# Patient Record
Sex: Female | Born: 1985 | Race: White | Hispanic: No | Marital: Single | Smoking: Never smoker
Health system: Southern US, Community
[De-identification: ages and names within clinical notes are randomized; demographics above are authoritative.]

## PROBLEM LIST (undated history)

## (undated) DIAGNOSIS — R102 Pelvic and perineal pain: Secondary | ICD-10-CM

## (undated) DIAGNOSIS — R350 Frequency of micturition: Secondary | ICD-10-CM

## (undated) DIAGNOSIS — R3915 Urgency of urination: Secondary | ICD-10-CM

---
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
## 2007-02-02 ENCOUNTER — Emergency Department (HOSPITAL_COMMUNITY): Admission: EM | Admit: 2007-02-02 | Discharge: 2007-02-02 | Payer: Self-pay | Admitting: Emergency Medicine

## 2007-12-30 IMAGING — CR DG ABDOMEN ACUTE W/ 1V CHEST
3 series · 3 of 3 positions shown · non-contrast
Comparison: None

CLINICAL DATA: Abdominal pain

ABDOMEN SERIES - 2 VIEW & CHEST - 1 VIEW

[w chest pa]
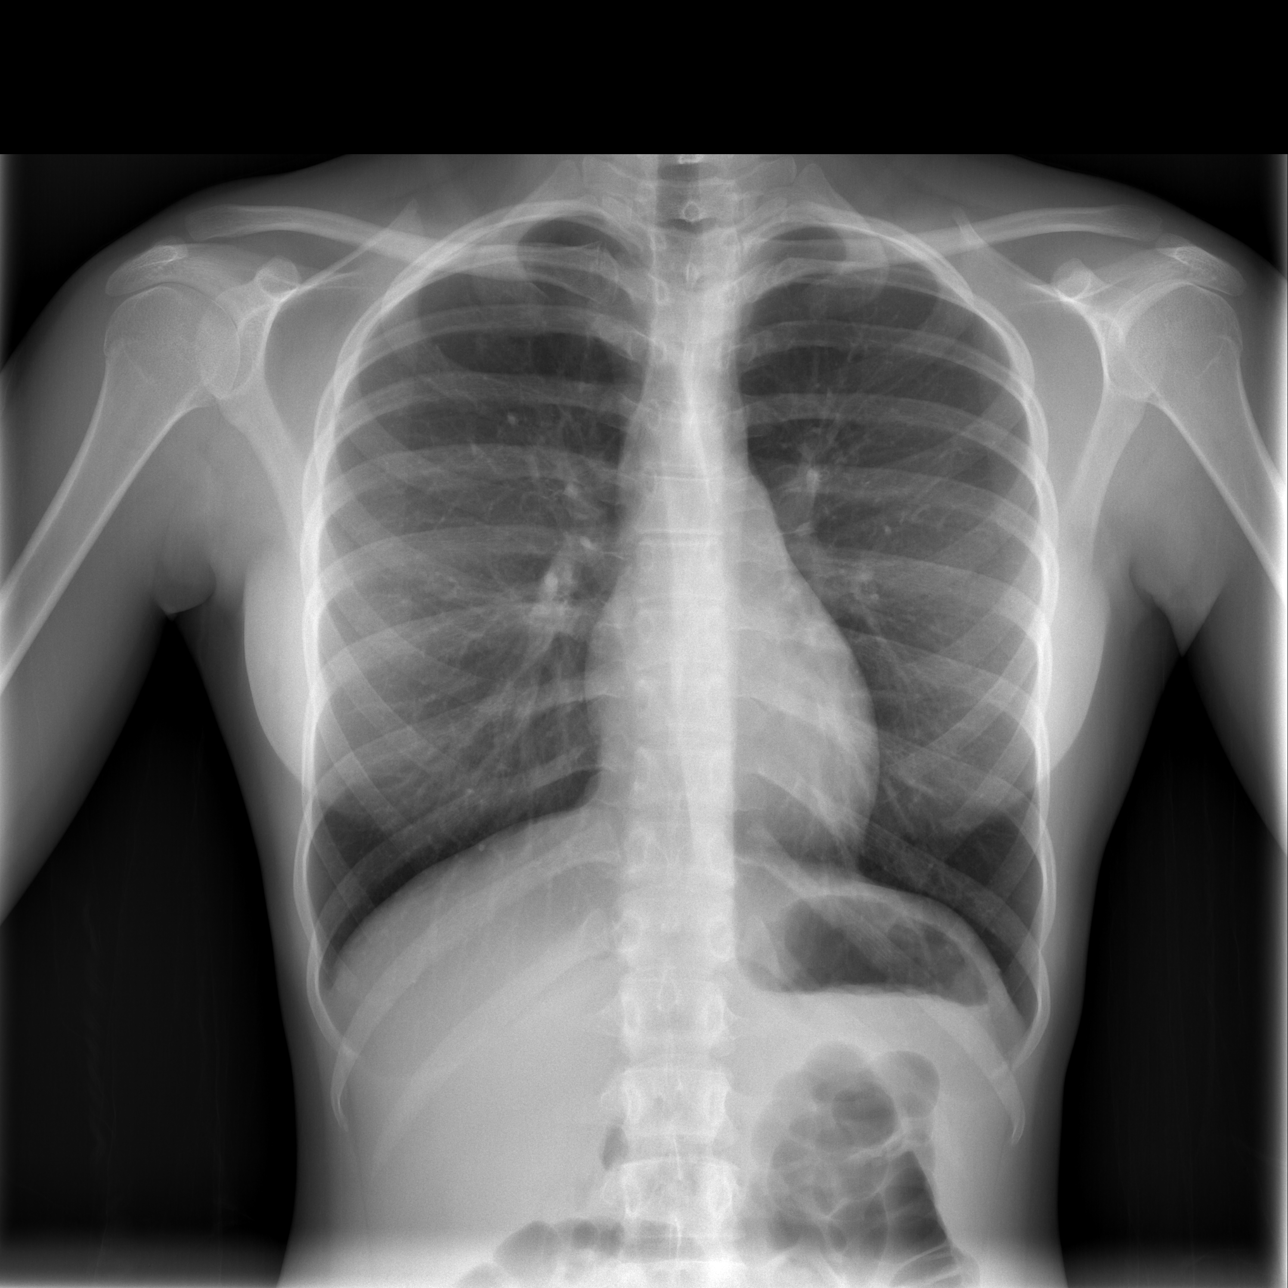

[w abdomen upright *]
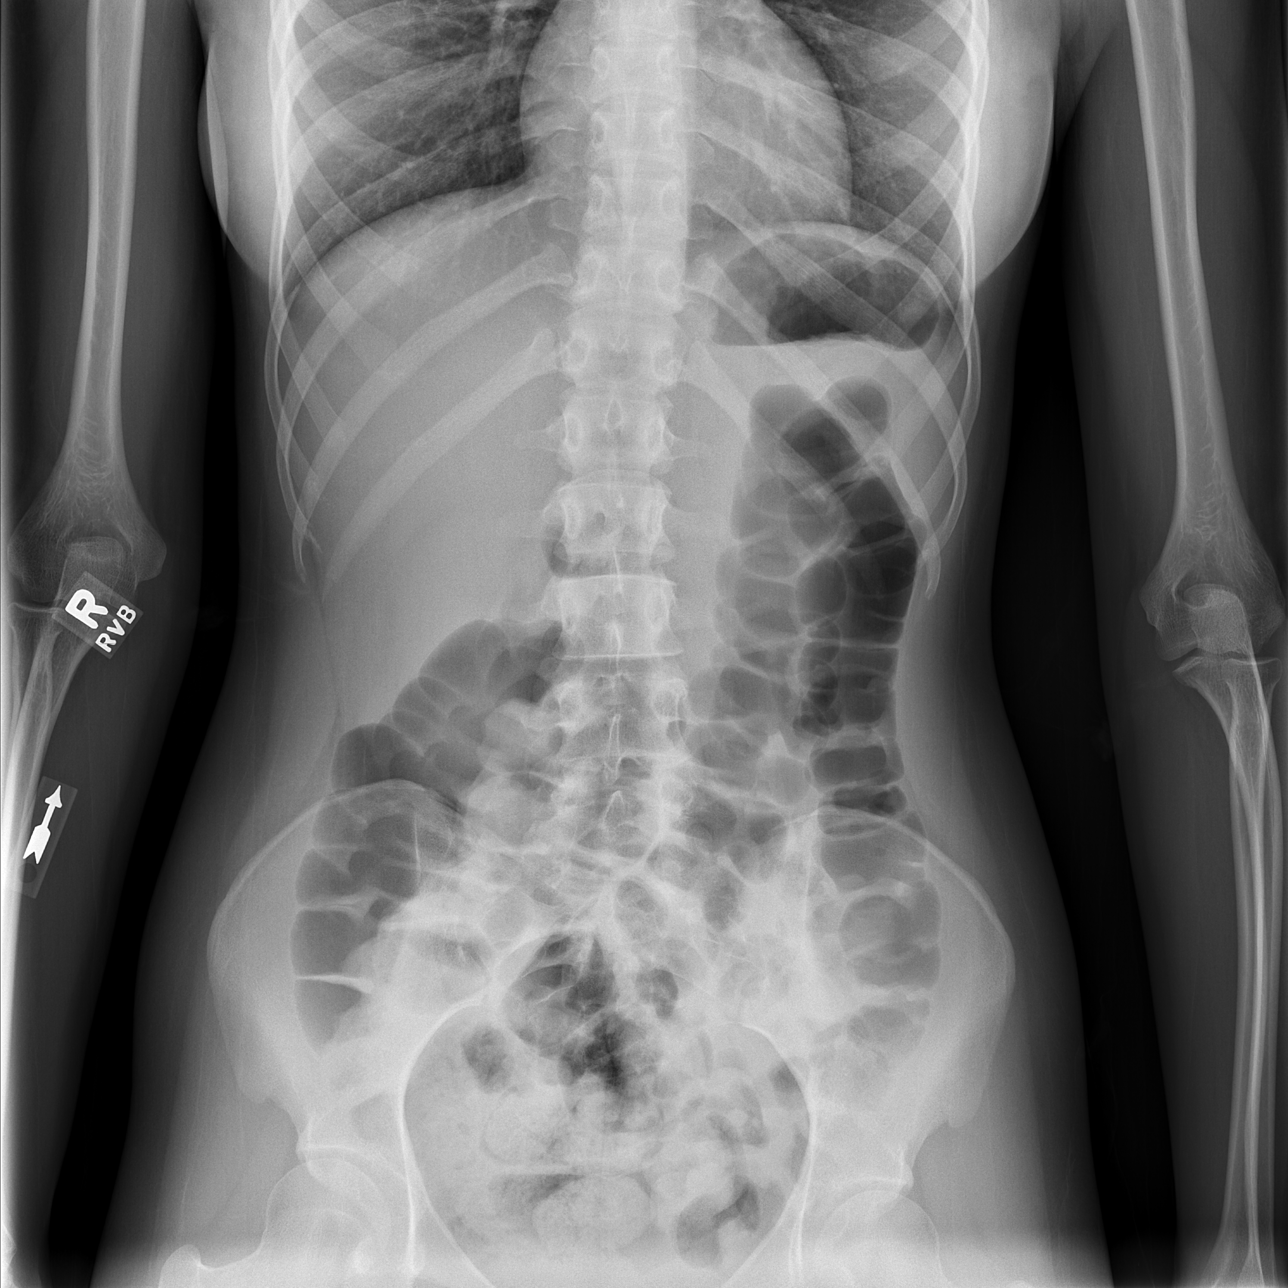

[t abdomen supine]
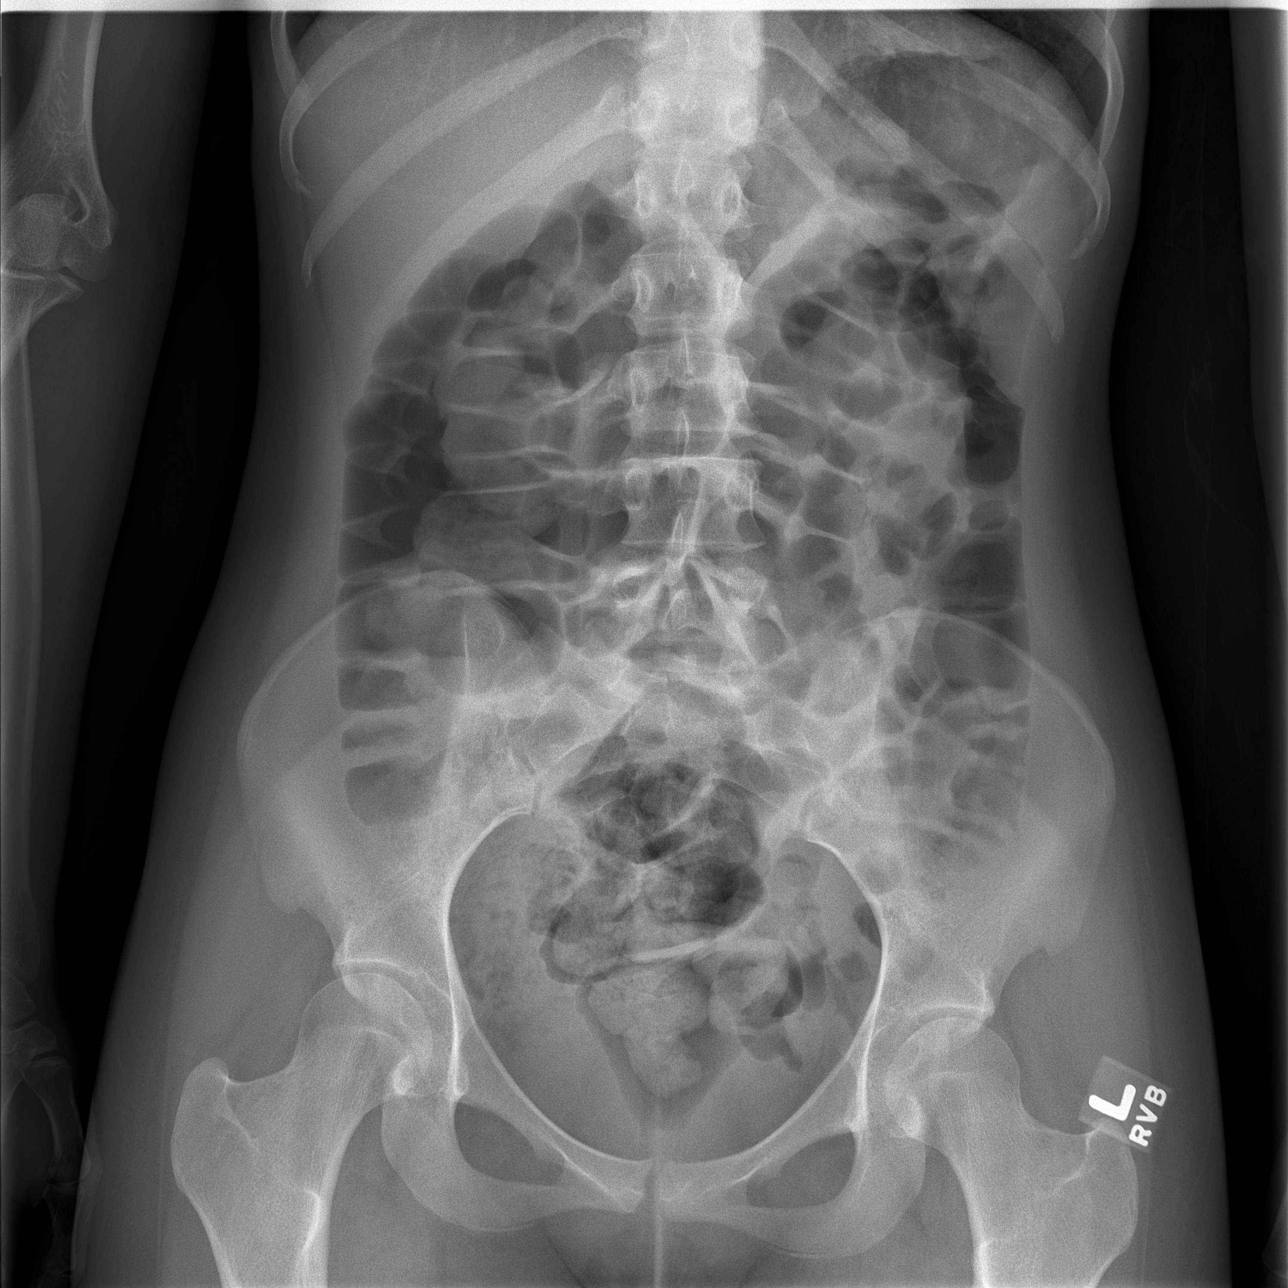

[3 of 3 positions shown; findings below may reference images not displayed]

FINDINGS: Heart and mediastinal contours are within normal limits. Lungs are
clear.

There is a nonobstructive bowel gas pattern. Mild gaseous distention is noted of
the bowel diffusely. No free air. No organomegaly or suspicious calcification.

IMPRESSION

Mild diffuse gaseous distention of bowel, possibly mild ileus. No obstruction or
free air.

## 2009-04-08 ENCOUNTER — Encounter (INDEPENDENT_AMBULATORY_CARE_PROVIDER_SITE_OTHER): Payer: Self-pay | Admitting: *Deleted

## 2009-04-25 ENCOUNTER — Ambulatory Visit: Payer: Self-pay | Admitting: Family Medicine

## 2009-04-25 DIAGNOSIS — R109 Unspecified abdominal pain: Secondary | ICD-10-CM | POA: Insufficient documentation

## 2009-04-26 ENCOUNTER — Encounter (INDEPENDENT_AMBULATORY_CARE_PROVIDER_SITE_OTHER): Payer: Self-pay | Admitting: *Deleted

## 2009-04-26 LAB — CONVERTED CEMR LAB
ALT: 17 units/L (ref 0–35)
Albumin: 4.5 g/dL (ref 3.5–5.2)
Alkaline Phosphatase: 50 units/L (ref 39–117)
Bilirubin, Direct: 0 mg/dL (ref 0.0–0.3)
Cholesterol: 170 mg/dL (ref 0–200)
Eosinophils Absolute: 0.1 10*3/uL (ref 0.0–0.7)
Eosinophils Relative: 0.8 % (ref 0.0–5.0)
HCT: 41.9 % (ref 36.0–46.0)
HDL: 49.6 mg/dL (ref >39.00)
Hemoglobin: 14.5 g/dL (ref 12.0–15.0)
Lymphocytes Relative: 35.9 % (ref 12.0–46.0)
Lymphs Abs: 2.3 10*3/uL (ref 0.7–4.0)
Neutro Abs: 3.4 10*3/uL (ref 1.4–7.7)
Neutrophils Relative %: 54.4 % (ref 43.0–77.0)
Total CHOL/HDL Ratio: 3
Total Protein: 8.1 g/dL (ref 6.0–8.3)
Triglycerides: 71 mg/dL (ref 0.0–149.0)
VLDL: 14.2 mg/dL (ref 0.0–40.0)
WBC: 6.4 10*3/uL (ref 4.5–10.5)

## 2009-06-22 ENCOUNTER — Ambulatory Visit: Payer: Self-pay | Admitting: Family Medicine

## 2009-07-11 ENCOUNTER — Ambulatory Visit: Payer: Self-pay | Admitting: Internal Medicine

## 2009-07-11 LAB — CONVERTED CEMR LAB
Bilirubin Urine: NEGATIVE
Blood in Urine, dipstick: NEGATIVE
Glucose, Urine, Semiquant: NEGATIVE
WBC Urine, dipstick: NEGATIVE
pH: 6

## 2009-07-12 ENCOUNTER — Telehealth: Payer: Self-pay | Admitting: Internal Medicine

## 2010-01-05 ENCOUNTER — Ambulatory Visit: Payer: Self-pay | Admitting: Family Medicine

## 2010-01-05 DIAGNOSIS — R599 Enlarged lymph nodes, unspecified: Secondary | ICD-10-CM | POA: Insufficient documentation

## 2010-01-20 ENCOUNTER — Telehealth (INDEPENDENT_AMBULATORY_CARE_PROVIDER_SITE_OTHER): Payer: Self-pay | Admitting: *Deleted

## 2010-01-25 ENCOUNTER — Encounter: Admission: RE | Admit: 2010-01-25 | Discharge: 2010-01-25 | Payer: Self-pay | Admitting: Family Medicine

## 2010-01-26 ENCOUNTER — Telehealth (INDEPENDENT_AMBULATORY_CARE_PROVIDER_SITE_OTHER): Payer: Self-pay | Admitting: *Deleted

## 2010-01-30 ENCOUNTER — Encounter (INDEPENDENT_AMBULATORY_CARE_PROVIDER_SITE_OTHER): Payer: Self-pay | Admitting: *Deleted

## 2010-02-01 ENCOUNTER — Ambulatory Visit: Payer: Self-pay | Admitting: Family Medicine

## 2010-02-01 DIAGNOSIS — Z9189 Other specified personal risk factors, not elsewhere classified: Secondary | ICD-10-CM | POA: Insufficient documentation

## 2010-02-03 ENCOUNTER — Encounter: Payer: Self-pay | Admitting: Family Medicine

## 2010-10-08 ENCOUNTER — Encounter: Payer: Self-pay | Admitting: Internal Medicine

## 2010-10-17 NOTE — Progress Notes (Signed)
Summary: ultrasound results  Phone Note Outgoing Call   Call placed by: Doristine Devoid,  Jan 26, 2010 11:13 AM Call placed to: Patient Summary of Call: lump pt is feeling is normal appearing lymph node.  please let her know   Follow-up for Phone Call        spoke w/ patient aware of ultrasound results.........Marland KitchenDoristine Devoid  Jan 26, 2010 11:15 AM

## 2010-10-17 NOTE — Assessment & Plan Note (Signed)
Summary: lymph node under arm/kdc   Vital Signs:  Patient profile:   25 year old female Weight:      102 pounds Temp:     99.2 degrees F oral BP sitting:   100 / 60  (left arm)  Vitals Entered By: Doristine Devoid (January 05, 2010 11:55 AM) CC: swollen and tender lymph node under both arms x1 week    History of Present Illness: 25 yo woman here today for swollen and tender LN.  first noticed 1 week ago.  located under L arm, some discomfort under R arm.  no drainage.  no redness.  uses disposable razors.  not visible on surface, only palpable.  no fevers or recent illness.  no other LNs.  Current Medications (verified): 1)  Aviane 0.1-20 Mg-Mcg Tabs (Levonorgestrel-Ethinyl Estrad) .... Take One Tablet Daily 2)  Align .Marland Kitchen.. 1 Daily 3)  Dicyclomine Hcl 10 Mg Caps (Dicyclomine Hcl) .... One or Two By Mouth Every 8 Hours As Needed For Pain 4)  Augmentin 875-125 Mg Tabs (Amoxicillin-Pot Clavulanate) .Marland Kitchen.. 1 By Mouth 2 Times Daily W/ Food  Allergies (verified): No Known Drug Allergies  Review of Systems      See HPI  Physical Exam  General:  alert and well-developed.   Cervical Nodes:  No lymphadenopathy noted Axillary Nodes:  2 cm firm, tender LN deep under L arm/upper outer breast no palpable nodes on R   Impression & Recommendations:  Problem # 1:  LYMPH NODE-ENLARGED (ICD-785.6) Assessment New pt's painful node likely infected/reactive.  start abx.  if no improvement will refer for [place] to r/o breast mass.  reviewed supportive care and red flags that should prompt return.  Pt expresses understanding and is in agreement w/ this plan. Her updated medication list for this problem includes:    Augmentin 875-125 Mg Tabs (Amoxicillin-pot clavulanate) .Marland Kitchen... 1 by mouth 2 times daily w/ food  Complete Medication List: 1)  Aviane 0.1-20 Mg-mcg Tabs (Levonorgestrel-ethinyl estrad) .... Take one tablet daily 2)  Align  .Marland Kitchen.. 1 daily 3)  Dicyclomine Hcl 10 Mg Caps (Dicyclomine hcl) .... One  or two by mouth every 8 hours as needed for pain 4)  Augmentin 875-125 Mg Tabs (Amoxicillin-pot clavulanate) .Marland Kitchen.. 1 by mouth 2 times daily w/ food  Patient Instructions: 1)  Call me if the area isn't improving or if worsening 2)  If no better by mid week we'll arrange your ultrasound 3)  Take your antibiotics w/ food to avoid upset stomach 4)  Use back up birth control (condoms) this month b/c of the meds 5)  Call with any questions or concerns 6)  Happy Odis Luster! Prescriptions: AUGMENTIN 875-125 MG TABS (AMOXICILLIN-POT CLAVULANATE) 1 by mouth 2 times daily w/ food  #14 x 0   Entered and Authorized by:   Neena Rhymes MD   Signed by:   Neena Rhymes MD on 01/05/2010   Method used:   Electronically to        [facility] (retail)       [address]       [place], [place]  [address]       Ph: [phone]       Fax: [phone]   RxID:   [id number]

## 2010-10-17 NOTE — Assessment & Plan Note (Signed)
Summary: VARICELLA TITER,TDAP/CDJ  Nurse Visit   Allergies: No Known Drug Allergies  Immunizations Administered:  Tetanus Vaccine:    Vaccine Type: Tdap    Site: left deltoid    Mfr: GlaxoSmithKline    Dose: 0.5 ml    Route: IM    Given by: Doristine Devoid    Exp. Date: 11/12/2011    Lot #: [id number]  Orders Added: 1)  Venipuncture [57846] 2)  T-Varicella-Zoster Antibody [96295-28413] 3)  Tdap => 98yrs IM [90715] 4)  Admin 1st Vaccine [24401]

## 2010-10-17 NOTE — Letter (Signed)
Summary: Physical Assessment & Immunization [facility]  Physical Assessment & Immunization [facility]   Imported By: Lanelle Bal 02/14/2010 13:43:32  _____________________________________________________________________  External Attachment:    Type:   Image     Comment:   External Document

## 2010-10-17 NOTE — Miscellaneous (Signed)
  Clinical Lists Changes  Observations: Added new observation of HEPBVAX#3: Historical (12/14/1999 11:06) Added new observation of HEPBVAX#2: Historical (08/25/1999 11:06) Added new observation of HEPBVAX#1: Historical (07/04/1999 11:06) Added new observation of MMR #2: Historical (01/27/1993 11:06) Added new observation of DPT #5: Historical (01/27/1993 11:06) Added new observation of OPV #3: Historical (05/31/1992 11:06) Added new observation of DPT #4: Historical (05/31/1992 11:06) Added new observation of MMR #1: Historical (08/12/1989 11:06) Added new observation of DPT #3: Historical (10/18/1988 11:06) Added new observation of OPV #2: Historical (08/02/1988 11:06) Added new observation of DPT #2: Historical (08/02/1988 11:06) Added new observation of OPV #1: Historical (06/05/1988 11:06) Added new observation of DPT #1: Historical (06/05/1988 11:06)      Immunization History:  Hepatitis B Immunization History:    Hepatitis B # 1:  historical (07/04/1999)    Hepatitis B # 2:  historical (08/25/1999)    Hepatitis B # 3:  historical (12/14/1999)  DPT Immunization History:    DPT # 1:  historical (06/05/1988)    DPT # 2:  historical (08/02/1988)    DPT # 3:  historical (10/18/1988)    DPT # 4:  historical (05/31/1992)    DPT # 5:  historical (01/27/1993)  Polio Immunization History:    Polio # 1:  historical (06/05/1988)    Polio # 2:  historical (08/02/1988)    Polio # 3:  historical (05/31/1992)  MMR Immunization History:    MMR # 1:  historical (08/12/1989)    MMR # 2:  historical (01/27/1993)

## 2010-10-17 NOTE — Progress Notes (Signed)
Summary: PT WANTS U/S  Phone Note Call from Patient Call back at [phone]   Caller: Patient Summary of Call: Was seen 01/05/10; breast is a little better, but can still feel it,  PT WANTS TO HAVE  THE ULTRASOUND .Kandice Hams  Jan 20, 2010 2:28 PM  Initial call taken by: Kandice Hams,  Jan 20, 2010 2:27 PM  Follow-up for Phone Call        ok.  will order. Follow-up by: Neena Rhymes MD,  Jan 20, 2010 2:39 PM  Additional Follow-up for Phone Call Additional follow up Details #1::        pt informed; order is in  process and will be contacted by our referral coordinator. Kandice Hams  Jan 20, 2010 2:47 PM  Additional Follow-up by: Kandice Hams,  Jan 20, 2010 2:47 PM

## 2011-04-05 ENCOUNTER — Encounter: Payer: Self-pay | Admitting: Family Medicine

## 2012-05-23 ENCOUNTER — Other Ambulatory Visit: Payer: Self-pay | Admitting: Urology

## 2012-05-26 MED ORDER — PHENAZOPYRIDINE HCL 200 MG PO TABS: Freq: Once | ORAL | Status: AC

## 2012-06-05 ENCOUNTER — Encounter (HOSPITAL_BASED_OUTPATIENT_CLINIC_OR_DEPARTMENT_OTHER): Payer: Self-pay | Admitting: *Deleted

## 2012-06-05 NOTE — Progress Notes (Signed)
NPO AFTER MN. ARRIVES AT 1015. NEEDS HG AND URINE PREG. 

## 2012-06-09 NOTE — H&P (Signed)
History of Present Illness   I saw [name] approximately 2 years ago for possible interstitial cystitis but we did not pursue investigations.   She is still having cramping and fullness and suprapubic pressure. She feels like she has to void all the time and tries to ignore it. It is getting worse over time especially in the last month. The episodes seem to be random. They are sometimes associated with increased frequency. Again she was told she had a normal urine.   There is no other modifying factors or associated signs or symptoms. There is no other aggravating or relieving factors. She is having more sensation to urinate or discomfort with intercourse. 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   Urinalysis: I reviewed, negative. Urine was sent for culture.   Review of systems: No change in bowel or neurologic status.    Current Meds 1. Kariva TABS; Therapy: (Recorded:23Aug2013) to  Allergies Non-Medication  1. Latex  Family History Problems  1. Paternal grandfather's history of  Family Health Status - Father's Age 6 2. Paternal grandfather's history of  Family Health Status - Mother's Age 53 3. Paternal grandfather's history of  Prostate Cancer V16.42  Social History Problems  1. Caffeine Use 2. Marital History - Single 3. Never A Smoker 4. Occupation: [employer] Denied  5. History of  Alcohol Use 6. History of  Tobacco Use  Vitals Vital Signs [Data Includes: Last 1 Day]  23Aug2013 02:34PM  BMI Calculated: 18.88 BSA Calculated: 1.41 Height: 5 ft 1 in Weight: 100 lb  Blood Pressure: 101 / 63 Temperature: 98.4 F Heart Rate: 72  Results/Data Urine [Data Includes: Last 1 Day]   23Aug2013  COLOR YELLOW   APPEARANCE CLEAR   SPECIFIC GRAVITY 1.015   pH 7.0   GLUCOSE NEG mg/dL  BILIRUBIN NEG   KETONE NEG mg/dL  BLOOD NEG   PROTEIN NEG mg/dL  UROBILINOGEN 0.2 mg/dL  NITRITE NEG   LEUKOCYTE ESTERASE NEG     Assessment Assessed  1. Abdominal Pain 789.00 2. Chronic Cystitis 595.2 3. Dyspareunia 625.0  Plan Health Maintenance (V70.0)  1. UA With REFLEX  Done: 23Aug2013 01:59PM  Discussion/Summary   The more I hear Pamela Cunningham's story, she may have interstitial cystitis. She is going to proceed with a hydrodistension.   We talked about cystoscopy/hydrodistension and instillation in detail. Pros, cons, general surgical and anesthetic risks, and other options including watchful waiting were discussed. Risks were described but not limited to pain, infection, and bleeding. The risk of bladder perforation and management were discussed. The patient understands that it is primarily a diagnostic procedure.   I will call if the culture is positive. We will proceed accordingly.   After a thorough review of the management options for the patient's condition the patient  elected to proceed with surgical therapy as noted above. We have discussed the potential benefits and risks of the procedure, side effects of the proposed treatment, the likelihood of the patient achieving the goals of the procedure, and any potential problems that might occur during the procedure or recuperation. Informed consent has been obtained.

## 2012-06-10 ENCOUNTER — Encounter (HOSPITAL_BASED_OUTPATIENT_CLINIC_OR_DEPARTMENT_OTHER): Payer: Self-pay | Admitting: Anesthesiology

## 2012-06-10 ENCOUNTER — Encounter (HOSPITAL_BASED_OUTPATIENT_CLINIC_OR_DEPARTMENT_OTHER)
Admission: RE | Disposition: A | Discharge status: Home / Self Care | Payer: Self-pay | Source: Ambulatory Visit | Attending: Urology

## 2012-06-10 ENCOUNTER — Ambulatory Visit (HOSPITAL_BASED_OUTPATIENT_CLINIC_OR_DEPARTMENT_OTHER): Payer: 59 | Admitting: Anesthesiology

## 2012-06-10 ENCOUNTER — Encounter (HOSPITAL_BASED_OUTPATIENT_CLINIC_OR_DEPARTMENT_OTHER): Payer: Self-pay

## 2012-06-10 ENCOUNTER — Ambulatory Visit (HOSPITAL_BASED_OUTPATIENT_CLINIC_OR_DEPARTMENT_OTHER)
Admission: RE | Admit: 2012-06-10 | Discharge: 2012-06-10 | Disposition: A | Discharge status: Home / Self Care | Payer: 59 | Source: Ambulatory Visit | Attending: Urology | Admitting: Urology

## 2012-06-10 DIAGNOSIS — N3010 Interstitial cystitis (chronic) without hematuria: Secondary | ICD-10-CM | POA: Insufficient documentation

## 2012-06-10 DIAGNOSIS — R109 Unspecified abdominal pain: Secondary | ICD-10-CM | POA: Insufficient documentation

## 2012-06-10 DIAGNOSIS — IMO0002 Reserved for concepts with insufficient information to code with codable children: Secondary | ICD-10-CM | POA: Insufficient documentation

## 2012-06-10 HISTORY — DX: Frequency of micturition: R35.0

## 2012-06-10 HISTORY — DX: Urgency of urination: R39.15

## 2012-06-10 HISTORY — PX: CYSTO WITH HYDRODISTENSION: SHX5453

## 2012-06-10 HISTORY — DX: Pelvic and perineal pain: R10.2

## 2012-06-10 LAB — POCT PREGNANCY, URINE: Preg Test, Ur: NEGATIVE

## 2012-06-10 SURGERY — CYSTOSCOPY, WITH BLADDER HYDRODISTENSION
Anesthesia: General | Site: Bladder | Wound class: Clean Contaminated

## 2012-06-10 MED ORDER — LACTATED RINGERS IV SOLN
INTRAVENOUS | Status: DC
  Administered 2012-06-10: 11:00:00 via INTRAVENOUS

## 2012-06-10 MED ORDER — ONDANSETRON HCL 4 MG/2ML IJ SOLN
INTRAMUSCULAR | Status: DC | PRN
  Administered 2012-06-10: 4 mg via INTRAVENOUS

## 2012-06-10 MED ORDER — CIPROFLOXACIN HCL 250 MG PO TABS: 250.0000 mg | ORAL_TABLET | Freq: Two times a day (BID) | ORAL | Status: DC

## 2012-06-10 MED ORDER — KETOROLAC TROMETHAMINE 30 MG/ML IJ SOLN: 15.0000 mg | Freq: Once | INTRAMUSCULAR | Status: DC | PRN

## 2012-06-10 MED ORDER — PROMETHAZINE HCL 25 MG/ML IJ SOLN: 6.2500 mg | INTRAMUSCULAR | Status: DC | PRN

## 2012-06-10 MED ORDER — MIDAZOLAM HCL 5 MG/5ML IJ SOLN
INTRAMUSCULAR | Status: DC | PRN
  Administered 2012-06-10: 2 mg via INTRAVENOUS

## 2012-06-10 MED ORDER — PHENAZOPYRIDINE HCL 200 MG PO TABS
ORAL | Status: DC | PRN
  Administered 2012-06-10: 12:00:00 via INTRAVESICAL

## 2012-06-10 MED ORDER — HYDROCODONE-ACETAMINOPHEN 5-500 MG PO TABS: 1.0000 | ORAL_TABLET | Freq: Four times a day (QID) | ORAL | Status: DC | PRN

## 2012-06-10 MED ORDER — LIDOCAINE HCL (CARDIAC) 20 MG/ML IV SOLN
INTRAVENOUS | Status: DC | PRN
  Administered 2012-06-10: 80 mg via INTRAVENOUS

## 2012-06-10 MED ORDER — FENTANYL CITRATE 0.05 MG/ML IJ SOLN
INTRAMUSCULAR | Status: DC | PRN
  Administered 2012-06-10 (×2): 50 ug via INTRAVENOUS

## 2012-06-10 MED ORDER — PROPOFOL 10 MG/ML IV BOLUS
INTRAVENOUS | Status: DC | PRN
  Administered 2012-06-10: 200 mg via INTRAVENOUS

## 2012-06-10 MED ORDER — FENTANYL CITRATE 0.05 MG/ML IJ SOLN: 25.0000 ug | INTRAMUSCULAR | Status: DC | PRN

## 2012-06-10 MED ORDER — STERILE WATER FOR IRRIGATION IR SOLN
Status: DC | PRN
  Administered 2012-06-10: 1

## 2012-06-10 MED ORDER — DEXAMETHASONE SODIUM PHOSPHATE 4 MG/ML IJ SOLN
INTRAMUSCULAR | Status: DC | PRN
  Administered 2012-06-10: 10 mg via INTRAVENOUS

## 2012-06-10 MED ORDER — CIPROFLOXACIN IN D5W 400 MG/200ML IV SOLN
400.0000 mg | INTRAVENOUS | Status: AC
  Administered 2012-06-10: 400 mg via INTRAVENOUS

## 2012-06-10 SURGICAL SUPPLY — 26 items
BAG DRAIN URO-CYSTO SKYTR STRL (DRAIN) ×2 IMPLANT
BAG DRN UROCATH (DRAIN) ×1
CANISTER SUCT LVC 12 LTR MEDI- (MISCELLANEOUS) ×1 IMPLANT
CATH FOLEY 2WAY  5CC 16FR SIL (CATHETERS) ×1
CATH FOLEY 2WAY 5CC 16FR SIL (CATHETERS) IMPLANT
CATH FOLEY 2WAY SLVR  5CC 18FR (CATHETERS)
CATH FOLEY 2WAY SLVR 5CC 18FR (CATHETERS) IMPLANT
CATH ROBINSON RED A/P 12FR (CATHETERS) IMPLANT
CATH ROBINSON RED A/P 14FR (CATHETERS) IMPLANT
CLOTH BEACON ORANGE TIMEOUT ST (SAFETY) ×2 IMPLANT
DRAPE CAMERA CLOSED 9X96 (DRAPES) ×2 IMPLANT
ELECT REM PT RETURN 9FT ADLT (ELECTROSURGICAL)
ELECTRODE REM PT RTRN 9FT ADLT (ELECTROSURGICAL) IMPLANT
GLOVE BIO SURGEON STRL SZ7.5 (GLOVE) ×1 IMPLANT
GLOVE INDICATOR 6.0 STRL GRN (GLOVE) ×2 IMPLANT
GLOVE SKINSENSE NS SZ6.5 (GLOVE) ×1
GLOVE SKINSENSE NS SZ7.5 (GLOVE) ×1
GLOVE SKINSENSE STRL SZ6.5 (GLOVE) IMPLANT
GLOVE SKINSENSE STRL SZ7.5 (GLOVE) IMPLANT
GOWN STRL REIN XL XLG (GOWN DISPOSABLE) ×3 IMPLANT
NDL SAFETY ECLIPSE 18X1.5 (NEEDLE) ×1 IMPLANT
NEEDLE HYPO 18GX1.5 SHARP (NEEDLE) ×2
PACK CYSTOSCOPY (CUSTOM PROCEDURE TRAY) ×2 IMPLANT
SUT SILK 0 TIES 10X30 (SUTURE) IMPLANT
SYR 20CC LL (SYRINGE) ×2 IMPLANT
WATER STERILE IRR 3000ML UROMA (IV SOLUTION) ×2 IMPLANT

## 2012-06-10 NOTE — Anesthesia Procedure Notes (Signed)
Procedure Name: LMA Insertion Date/Time: 06/10/2012 11:49 AM Performed by: Norva Pavlov Pre-anesthesia Checklist: Patient identified, Emergency Drugs available, Suction available and Patient being monitored Patient Re-evaluated:Patient Re-evaluated prior to inductionOxygen Delivery Method: Circle System Utilized Preoxygenation: Pre-oxygenation with 100% oxygen Intubation Type: IV induction Ventilation: Mask ventilation without difficulty LMA: LMA inserted LMA Size: 3.0 Number of attempts: 1 Airway Equipment and Method: bite block Placement Confirmation: positive ETCO2 Tube secured with: Tape Dental Injury: Teeth and Oropharynx as per pre-operative assessment

## 2012-06-10 NOTE — Op Note (Signed)
Preoperative diagnosis: Pelvic pain Postoperative diagnosis: Interstitial cystitis Surgery: Cystoscopy bladder hydrodistention and bladder installation therapy Surgeon: Dr. Lorin Picket Allysa Governale  The patient consented to the above procedure and preoperative antibiotics were given. 22 French cystoscope and latex precautions were provided with a possible allergy  Bladder mucosa and trigone were normal. There is no foreign body or evidence of cystitis or cancer. She was hydrodistended to 550 mL  The bladder was emptied and reinspected. She had mild glomerulations at 5 and 7:00. There is no injury to bladder  As a separate procedure a 16 French latex catheter was inserted into the bladder and I instilled 15 cc of 0.5% Marcaine was 400 mg of peridium  The patient will be treated with a diagnosis of interstitial cystitis

## 2012-06-10 NOTE — Transfer of Care (Signed)
Immediate Anesthesia Transfer of Care Note  Patient: Pamela Cunningham  Procedure(s) Performed: Procedure(s) (LRB): CYSTOSCOPY/HYDRODISTENSION (N/A)  Patient Location: PACU  Anesthesia Type: General  Level of Consciousness:not arouseable, regular respirations, oral airway in place.  Airway & Oxygen Therapy: Patient Spontanous Breathing and Patient connected to face mask oxygen  Post-op Assessment: Report given to PACU RN and Post -op Vital signs reviewed and stable  Post vital signs: Reviewed and stable  Complications: No apparent anesthesia complications

## 2012-06-10 NOTE — Anesthesia Preprocedure Evaluation (Signed)

## 2012-06-10 NOTE — Anesthesia Postprocedure Evaluation (Signed)
  Anesthesia Post-op Note  Patient: Pamela Cunningham  Procedure(s) Performed: Procedure(s) (LRB): CYSTOSCOPY/HYDRODISTENSION (N/A)  Patient Location: PACU  Anesthesia Type: General  Level of Consciousness: awake and alert   Airway and Oxygen Therapy: Patient Spontanous Breathing  Post-op Pain: mild  Post-op Assessment: Post-op Vital signs reviewed, Patient's Cardiovascular Status Stable, Respiratory Function Stable, Patent Airway and No signs of Nausea or vomiting  Post-op Vital Signs: stable  Complications: No apparent anesthesia complications

## 2012-06-11 ENCOUNTER — Encounter (HOSPITAL_BASED_OUTPATIENT_CLINIC_OR_DEPARTMENT_OTHER): Payer: Self-pay | Admitting: Urology

## 2013-09-11 ENCOUNTER — Ambulatory Visit (INDEPENDENT_AMBULATORY_CARE_PROVIDER_SITE_OTHER): Payer: 59 | Admitting: Family Medicine

## 2013-09-11 ENCOUNTER — Encounter: Payer: Self-pay | Admitting: Family Medicine

## 2013-09-11 VITALS — BP 112/68 | HR 106 | Temp 99.7°F | Resp 16 | Wt 100.0 lb

## 2013-09-11 DIAGNOSIS — R6889 Other general symptoms and signs: Secondary | ICD-10-CM

## 2013-09-11 DIAGNOSIS — J111 Influenza due to unidentified influenza virus with other respiratory manifestations: Secondary | ICD-10-CM

## 2013-09-11 LAB — POCT INFLUENZA A/B: Influenza A, POC: POSITIVE

## 2013-09-11 MED ORDER — OSELTAMIVIR PHOSPHATE 75 MG PO CAPS: 75.0000 mg | ORAL_CAPSULE | Freq: Two times a day (BID) | ORAL | Status: DC

## 2013-09-11 NOTE — Patient Instructions (Signed)
Follow up as needed This is the flu Start the Tamiflu twice daily Drink plenty of fluids REST!!! Alternate tylenol and ibuprofen every 4 hrs for body aches and fever Call with any questions or concerns Hang in there! Happy New Year!!!

## 2013-09-11 NOTE — Assessment & Plan Note (Signed)
New.  Pt's sxs and rapid flu (+).  Start tamiflu.  Reviewed supportive care and red flags that should prompt return.  Pt expressed understanding and is in agreement w/ plan.

## 2013-09-11 NOTE — Progress Notes (Signed)
Pre visit review using our clinic review tool, if applicable. No additional management support is needed unless otherwise documented below in the visit note. 

## 2013-09-11 NOTE — Progress Notes (Signed)
   Subjective:    Patient ID: Pamela Cunningham, female    DOB: [birth year], 27 y.o.   MRN: [id number]  HPI Flu like sxs- started 12/24 after waking and 'feeling terrible'.  Taking Advil regularly.  Tm 101, + chills.  Chest soreness from coughing- not productive.  + body aches.  + sick contacts.  No N/V/D.   Review of Systems For ROS see HPI     Objective:   Physical Exam  Vitals reviewed. Constitutional: She appears well-developed and well-nourished. No distress.  Obviously not feeling well  HENT:  Head: Normocephalic and atraumatic.  Right Ear: Tympanic membrane normal.  Left Ear: Tympanic membrane normal.  Nose: Mucosal edema and rhinorrhea present. Right sinus exhibits no maxillary sinus tenderness and no frontal sinus tenderness. Left sinus exhibits no maxillary sinus tenderness and no frontal sinus tenderness.  Mouth/Throat: Uvula is midline and mucous membranes are normal. Posterior oropharyngeal erythema present. No oropharyngeal exudate.  Eyes: Conjunctivae and EOM are normal. Pupils are equal, round, and reactive to light.  Neck: Normal range of motion. Neck supple.  Cardiovascular: Normal rate, regular rhythm and normal heart sounds.   Pulmonary/Chest: Effort normal and breath sounds normal. No respiratory distress. She has no wheezes.  + dry cough  Lymphadenopathy:    She has no cervical adenopathy.  Skin: Skin is warm.          Assessment & Plan:

## 2014-02-24 ENCOUNTER — Ambulatory Visit: Payer: 59 | Admitting: Internal Medicine

## 2014-05-08 ENCOUNTER — Ambulatory Visit (INDEPENDENT_AMBULATORY_CARE_PROVIDER_SITE_OTHER): Payer: PRIVATE HEALTH INSURANCE | Admitting: Family Medicine

## 2014-05-08 ENCOUNTER — Encounter: Payer: Self-pay | Admitting: Family Medicine

## 2014-05-08 VITALS — BP 104/58 | HR 85 | Temp 98.1°F | Wt 99.5 lb

## 2014-05-08 DIAGNOSIS — R21 Rash and other nonspecific skin eruption: Secondary | ICD-10-CM | POA: Insufficient documentation

## 2014-05-08 DIAGNOSIS — J069 Acute upper respiratory infection, unspecified: Secondary | ICD-10-CM | POA: Insufficient documentation

## 2014-05-08 NOTE — Progress Notes (Signed)
Pre visit review using our clinic review tool, if applicable. No additional management support is needed unless otherwise documented below in the visit note. 

## 2014-05-08 NOTE — Progress Notes (Signed)
Subjective:   Patient ID: Pamela Cunningham, female    DOB: 1986/02/23, 28 y.o.   MRN: [id number]  Ermalinda Joubert is a pleasant 28 y.o. year old female new to me who presents to weekend clinic with her mom today with Rash  on 05/08/2014  HPI: Had URI symptoms last week- sore throat, runny nose, Tmax 101.  A couple of days before developing URI symptoms, she developed a rash on her abdomen- not itchy, not painful.  URI symptoms have resolved but rash has not.  In fact, she feel it is getting darker.  Has not spread to other parts of her body.  No changes in soaps, lotions or detergents.  No recent travel.  She does report history sensitive skin and gets "random rashes."  No current outpatient prescriptions on file prior to visit.   Current Facility-Administered Medications on File Prior to Visit  Medication Dose Route Frequency Provider Last Rate Last Dose  . bupivacaine (MARCAINE) 0.5 % 15 mL, phenazopyridine (PYRIDIUM) 400 mg bladder mixture   Bladder Instillation Once Martina Sinner, MD        Allergies  Allergen Reactions  . Latex Other (See Comments)    IRRITATION  . Doxycycline Rash    Past Medical History  Diagnosis Date  . Pelvic pain in female   . Frequency of urination   . Urgency of urination     Past Surgical History  Procedure Laterality Date  . Cysto with hydrodistension  06/10/2012    Procedure: CYSTOSCOPY/HYDRODISTENSION;  Surgeon: Martina Sinner, MD;  Location: [facility];  Service: Urology;  Laterality: N/A;  30 mins requested for this case  **LATEX ALLERGY**      Family History  Problem Relation Age of Onset  . Colon cancer Paternal Grandfather   . Breast cancer Paternal Aunt     History   Social History  . Marital Status: Single    Spouse Name: N/A    Number of Children: N/A  . Years of Education: N/A   Occupational History  . Not on file.   Social History Main Topics  . Smoking status: Never Smoker   .  Smokeless tobacco: Never Used  . Alcohol Use: 1.5 oz/week    3 drink(s) per week  . Drug Use: No  . Sexual Activity:    Other Topics Concern  . Not on file   Social History Narrative   Lives with mom, dad, and sister. Studying for [employer].   The PMH, PSH, Social History, Family History, Medications, and allergies have been reviewed in [facility], and have been updated if relevant.     Review of Systems    See HPI No SOB No wheezing No pain or difficulty swallowing Rash is not spreading  Objective:    BP 104/58  Pulse 85  Temp(Src) 98.1 F (36.7 C) (Oral)  Wt 99 lb 8 oz (45.133 kg)  SpO2 97%  LMP 05/04/2014   Physical Exam  Nursing note and vitals reviewed. Constitutional: She appears well-developed and well-nourished. No distress.  HENT:  Head: Normocephalic and atraumatic.  Mouth/Throat: No oropharyngeal exudate.  Eyes: Pupils are equal, round, and reactive to light.  Neck: Normal range of motion. Neck supple.  Cardiovascular: Normal rate and regular rhythm.   Pulmonary/Chest: Effort normal and breath sounds normal.  Abdominal: Soft.  Lymphadenopathy:    She has no cervical adenopathy.  Skin: Skin is warm and dry. Rash noted.  Raised macular rash on abdomen bilaterally, diffuse, no  other pattern noted          Assessment & Plan:   Rash and nonspecific skin eruption  Acute upper respiratory infections of unspecified site No Follow-up on file.

## 2014-05-08 NOTE — Assessment & Plan Note (Signed)
New- symptoms resolved. No further tx or work up at this time.

## 2014-05-08 NOTE — Assessment & Plan Note (Signed)
New- etiology unclear at this point. Not consistent with contact or allergic dermatitis- not itchy. Given recent URI symptoms, could be viral, less likely and not consistent with strep rash.  Advised continued supportive care.  Follow up with PCP for possible biopsy for definitive diagnosis. The patient indicates understanding of these issues and agrees with the plan.
